# Patient Record
Sex: Female | Born: 1990 | Race: Black or African American | Hispanic: No | Marital: Single | Smoking: Never smoker
Health system: Southern US, Community
[De-identification: ages and names within clinical notes are randomized; demographics above are authoritative.]

## PROBLEM LIST (undated history)

## (undated) DIAGNOSIS — R011 Cardiac murmur, unspecified: Secondary | ICD-10-CM

## (undated) HISTORY — PX: BUNIONECTOMY: SHX129

---

## 2013-11-11 ENCOUNTER — Emergency Department (HOSPITAL_COMMUNITY)
Admission: EM | Admit: 2013-11-11 | Discharge: 2013-11-11 | Disposition: A | Discharge status: Home / Self Care | Payer: BC Managed Care – PPO | Attending: Emergency Medicine | Admitting: Emergency Medicine

## 2013-11-11 ENCOUNTER — Encounter (HOSPITAL_COMMUNITY): Payer: Self-pay | Admitting: Emergency Medicine

## 2013-11-11 DIAGNOSIS — S79919A Unspecified injury of unspecified hip, initial encounter: Secondary | ICD-10-CM | POA: Insufficient documentation

## 2013-11-11 DIAGNOSIS — S79929A Unspecified injury of unspecified thigh, initial encounter: Principal | ICD-10-CM

## 2013-11-11 DIAGNOSIS — Y9389 Activity, other specified: Secondary | ICD-10-CM | POA: Insufficient documentation

## 2013-11-11 DIAGNOSIS — Y92410 Unspecified street and highway as the place of occurrence of the external cause: Secondary | ICD-10-CM | POA: Insufficient documentation

## 2013-11-11 NOTE — ED Notes (Signed)
PT ambulated with baseline gait; VSS; A&Ox3; no signs of distress; respirations even and unlabored; skin warm and dry; no questions upon discharge.  
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
## 2013-11-12 NOTE — ED Provider Notes (Signed)
Medical screening examination/treatment/procedure(s) were performed by non-physician practitioner and as supervising physician I was immediately available for consultation/collaboration.   EKG Interpretation None        Elizabeth SkeensJoshua M Halena Mohar, MD 11/12/13 [id number]

## 2013-11-21 ENCOUNTER — Encounter (HOSPITAL_COMMUNITY): Payer: Self-pay | Admitting: Emergency Medicine

## 2013-11-21 ENCOUNTER — Emergency Department (HOSPITAL_COMMUNITY)
Admission: EM | Admit: 2013-11-21 | Discharge: 2013-11-21 | Disposition: A | Discharge status: Home / Self Care | Payer: BC Managed Care – PPO | Attending: Emergency Medicine | Admitting: Emergency Medicine

## 2013-11-21 DIAGNOSIS — M25569 Pain in unspecified knee: Secondary | ICD-10-CM | POA: Insufficient documentation

## 2013-11-21 DIAGNOSIS — G8911 Acute pain due to trauma: Secondary | ICD-10-CM | POA: Insufficient documentation

## 2013-11-21 DIAGNOSIS — M25561 Pain in right knee: Secondary | ICD-10-CM

## 2013-11-21 MED ORDER — IBUPROFEN 800 MG PO TABS: 800.0000 mg | ORAL_TABLET | Freq: Three times a day (TID) | ORAL | Status: DC
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
## 2013-11-21 NOTE — Discharge Instructions (Signed)
Take Ibuprofen 600-800 mg every 6-8 hours = take with food  Rest, use knee sleeve, elevated, and apply cold compresses for relief  Return to the emergency department if you develop any changing/worsening condition, knee swelling, redness, weakness, fever, leg swelling, or any other concerns (please read additional information regarding your condition below)     Knee Pain Knee pain can be a result of an injury or other medical conditions. Treatment will depend on the cause of your pain. HOME CARE  Only take medicine as told by your doctor.  Keep a healthy weight. Being overweight can make the knee hurt more.  Stretch before exercising or playing sports.  If there is constant knee pain, change the way you exercise. Ask your doctor for advice.  Make sure shoes fit well. Choose the right shoe for the sport or activity.  Protect your knees. Wear kneepads if needed.  Rest when you are tired. GET HELP RIGHT AWAY IF:   Your knee pain does not stop.  Your knee pain does not get better.  Your knee joint feels hot to the touch.  You have a fever. MAKE SURE YOU:   Understand these instructions.  Will watch this condition.  Will get help right away if you are not doing well or get worse. Document Released: 11/09/2008 Document Revised: 11/05/2011 Document Reviewed: 11/09/2008 [facility] Patient Information 2014 [place], [place].  RICE: Routine Care for Injuries The routine care of many injuries includes Rest, Ice, Compression, and Elevation (RICE). HOME CARE INSTRUCTIONS  Rest is needed to allow your body to heal. Routine activities can usually be resumed when comfortable. Injured tendons and bones can take up to 6 weeks to heal. Tendons are the cord-like structures that attach muscle to bone.  Ice following an injury helps keep the swelling down and reduces pain.  Put ice in a plastic bag.  Place a towel between your skin and the bag.  Leave the ice on for 15-20 minutes, 03-04  times a day. Do this while awake, for the first 24 to 48 hours. After that, continue as directed by your caregiver.  Compression helps keep swelling down. It also gives support and helps with discomfort. If an elastic bandage has been applied, it should be removed and reapplied every 3 to 4 hours. It should not be applied tightly, but firmly enough to keep swelling down. Watch fingers or toes for swelling, bluish discoloration, coldness, numbness, or excessive pain. If any of these problems occur, remove the bandage and reapply loosely. Contact your caregiver if these problems continue.  Elevation helps reduce swelling and decreases pain. With extremities, such as the arms, hands, legs, and feet, the injured area should be placed near or above the level of the heart, if possible. SEEK IMMEDIATE MEDICAL CARE IF:  You have persistent pain and swelling.  You develop redness, numbness, or unexpected weakness.  Your symptoms are getting worse rather than improving after several days. These symptoms may indicate that further evaluation or further X-rays are needed. Sometimes, X-rays may not show a small broken bone (fracture) until 1 week or 10 days later. Make a follow-up appointment with your caregiver. Ask when your X-ray results will be ready. Make sure you get your X-ray results. Document Released: 11/25/2000 Document Revised: 11/05/2011 Document Reviewed: 01/12/2011 [facility] Patient Information 2014 [place] HeadquartersExitCare, MarylandLLC.  Emergency Department Resource Guide 1) Find a Doctor and Pay Out of Pocket Although you won't have to find out who is covered by your insurance plan, it is  a good idea to ask around and get recommendations. You will then need to call the office and see if the doctor you have chosen will accept you as a new patient and what types of options they offer for patients who are self-pay. Some doctors offer discounts or will set up payment plans for their patients who do not have insurance,  but you will need to ask so you aren't surprised when you get to your appointment.  2) Contact Your Local Health Department Not all health departments have doctors that can see patients for sick visits, but many do, so it is worth a call to see if yours does. If you don't know where your local health department is, you can check in your phone book. The CDC also has a tool to help you locate your state's health department, and many state websites also have listings of all of their local health departments.  3) Find a Walk-in Clinic If your illness is not likely to be very severe or complicated, you may want to try a walk in clinic. These are popping up all over the country in pharmacies, drugstores, and shopping centers. They're usually staffed by nurse practitioners or physician assistants that have been trained to treat common illnesses and complaints. They're usually fairly quick and inexpensive. However, if you have serious medical issues or chronic medical problems, these are probably not your best option.  No Primary Care Doctor: - Call Health Connect at  [phone] - they can help you locate a primary care doctor that  accepts your insurance, provides certain services, etc. - Physician Referral Service- [phone]  Chronic Pain Problems: Organization         Address  Phone   Notes  [facility]  [phone] Patients need to be referred by their primary care doctor.   Medication Assistance: Organization         Address  Phone   Notes  [facility] [address] [place] [place], [place] [address] [phone] --Must be a resident of [facility] -- Must have NO insurance coverage whatsoever (no Medicaid/ Medicare, etc.) -- The pt. MUST have a primary care doctor that directs their care regularly and follows them in the community   [facility]  [phone]   [employer]  [phone]    Agencies that provide inexpensive  medical care: Organization         Address  Phone   Notes  [facility]  [phone]   [facility]    [phone]   [facility] [address] [place], [place] [address] [phone]   [facility] [address], [place] [phone]   Planned Parenthood    [phone]   [facility]    [phone]   Community [facility] and [facility]  [address], [place] Phone:  [phone], Fax:  [phone] Hours of Operation:  9 am - 6 pm, M-F.  Also accepts Medicaid/Medicare and self-pay.  [facility]  [address], [place] Phone: [phone], Fax: [phone]. Hours of Operation:  8:30 am - 5:30 pm, M-F.  Also accepts Medicaid and self-pay.  [facility] [place] [address] [place] Phone: [phone]   [facility] [address] Natasha Bence [place], [place] [phone], Ext. [phone] Mondays & Thursdays: 7-9 AM.  First 15 patients are seen on a first come, first serve basis.    Medicaid-accepting [facility] Providers:  Organization         Address  Phone   Notes  [facility] [address], [place] [phone] Also accepts self-pay patients.  [facility] [address] Laurell Josephs [place], [place]  [phone]   [facility] [address], [place] [phone]   [facility] [address] [place] [phone]   Renaye Rakers [address], [place]   [phone] Only accepts [place] Access [place] patients after they have their name applied to their card.   Self-Pay (no insurance) in [facility]:  Organization         Address  Phone   Notes  Sickle Cell Patients, [facility] [address] [place], [place] [phone]   [facility] [address] [place], [place] [phone]   [facility]  [address], [place] [phone]   [facility]/Dr. Osei-Bonsu  [address] [place] or [address], [place] [phone] Phone number for both [place] and [place] locations is the same.  [facility] and [facility] [address], [place] [phone]   [facility] [address] [place] or [address] [phone]   [facility] [address] [place] [phone], phone; [phone], fax Sees patients 1st and 3rd Saturday of every month.  Must not qualify for public or private insurance (i.e. Medicaid, Medicare, [place] Health Choice, Veterans' Benefits)  Household income should be no more than 200% of the poverty level The clinic cannot treat you if you are pregnant or think you are pregnant  Sexually transmitted diseases are not treated at the clinic.    Dental Care: Organization         Address  Phone  Notes  [facility] Department of [facility] [address] [place], [place] [phone] Accepts children up to age 5 who are enrolled in [place] or [place] Health Choice; pregnant women with a Medicaid card; and children who have applied for Medicaid or [place] Health Choice, but were declined, whose parents can pay a reduced fee at time of service.  [facility] Department of [facility]  [address], [place] [phone] Accepts children up to age 17 who are enrolled in [place] or [place] Health Choice; pregnant women with a Medicaid card; and children who have applied for Medicaid or [place] Health Choice, but were declined, whose parents can pay a reduced fee at time of service.  [facility]  [address] [place], [place] [phone] Patients are seen by appointment only. Walk-ins are not accepted.  [facility] will see patients 39 years of age and older. Monday - Tuesday (8am-5pm) Most Wednesdays (8:30-5pm) $30 per visit, cash only  [facility] Adult [facility]  [address], [facility] [phone] Patients are seen by appointment only. Walk-ins are not accepted. [facility] will see patients 59 years of age and older. One Wednesday Evening (Monthly: Volunteer Based).  $30 per visit, cash only  [employer]  [phone] for adults; Children under age 64, call Graduate  Pediatric Dentistry at [phone]. Children aged 77-14, please call [phone] to request a pediatric application.  Dental services are provided in all areas of dental care including fillings, crowns and bridges, complete and partial dentures, implants, gum treatment, root canals, and extractions. Preventive care is also provided. Treatment is provided to both adults and children. Patients are selected via a lottery and there is often a waiting list.   [facility] [address] [place]  [phone] [email or web address]   [facility] [address] [place], [place] [phone], Ext. [phone] Second and Fourth Thursday of each month, opens at 6:30 AM; Clinic ends at 9 AM.  Patients are seen on a first-come first-served basis, and a limited number are seen during each clinic.   [facility]  [address] Ether Griffins [place], [place] [phone]   Eligibility Requirements You must have lived in [place], [place], or [place] counties for at least the last three months.   You cannot be eligible for state or federal sponsored [place], including [employer], [place], or [employer].   You generally cannot be eligible for healthcare insurance through your employer.    How to apply: Eligibility screenings are held every Tuesday and Wednesday afternoon from 1:00 pm until 4:00 pm. You do not need an appointment for the  interview!  [facility] [address], [place], [place] [phone]   [facility] Health Department  [phone]   [facility] Health Department  [phone]   [facility] Health Department  [phone]    Behavioral Health Resources in the Community: Intensive Outpatient Programs Organization         Address  Phone  Notes  [facility] [address] [place], [place] [phone]   [facility] [address], [place], [place] [phone]   [facility] [address] [place], [place]  [phone]   [facility] [address]  [place], [place] [phone] or [phone]   Substance Abuse Resources Organization         Address  Phone  Notes  [facility]  ([phone]   [facility]  [phone]   [facility] [place]  [phone]   Floydene Flock  [phone]   Residential & Outpatient Substance Abuse Program  [phone]   Psychological Services Organization         Address  Phone  Notes  [facility] Behavioral Health  [phone]   [facility]  [phone]   [facility] [address] [place] [phone] or [phone]    Mobile Crisis Teams Organization         Address  Phone  Notes  [facility], Mobile Crisis Care Unit  [phone]   [facility]  [address] [place], [place] [phone]   Doristine Locks [address] [place] [phone]    Self-Help/Support Groups Organization         Address  Phone             Notes  [facility] [place] - variety of support groups  336- I7437963 Call for more information  Narcotics Anonymous (NA), [facility] [address], [employer] [place]  2 meetings at this location   Statistician         Address  Phone  Notes  [facility]  [address],  [place]  [phone]   °[facility] ° [address], [place], [place] [phone]   °[facility] [address], [place] [phone] Admissions: 8am-3pm M-F  °[facility] [address]    °[place], [place] [phone]   °[facility] [address], [place], [place] [phone]   °[facility] [address]  °[place], [place] [phone]   °[facility] - Intensive Outpatient [address], [place], [place] [phone]   °[facility] [address]  °[place], [place] 1-877-615-2722 or [phone]   °[facility] ([facility]) [address] [place], [place] [phone] Accepts Medicaid  °[facility] [address]  °[place] 1-800-659-3381 Substance Abuse/Addiction Treatment  ° °[place] Behavioral Health Resources °Organization         Address  Phone  Notes  °[facility]  (888) [phone]   °Julie Brannon, PhD [address] [place], [place]   (336) 349-5553 or (336) 951-0000   ° Behavioral   [address] °[place], [place] ([phone]) [phone]   °[facility] [address], [place], [place] (336) [phone] Insurance/Medicaid/sponsorship through [facility]  °[facility] [address]                                    [place], [place] ([phone]) [phone] Therapy/tele-psych/case  °[facility] [address]  ° [place],  (336) [phone]    °Dr. Arfeen  ([phone]) [phone]   °[facility] [place]  [employer] [place] [facility] 1) [address],  °2) [address], [place] °3)  [address], [place] ([phone]) [phone] °(336) [phone] ° °(336) [phone]   °[place] Child Abuse Hotline (336) 342-1394 or (336) 342-3537 (After Hours)    ° ° ° °

## 2013-11-28 NOTE — ED Provider Notes (Signed)
Medical screening examination/treatment/procedure(s) were performed by non-physician practitioner and as supervising physician I was immediately available for consultation/collaboration.   Raney Koeppen L Delonda Coley, MD 11/28/13 0837 

## 2014-12-10 ENCOUNTER — Emergency Department (HOSPITAL_COMMUNITY)
Admission: EM | Admit: 2014-12-10 | Discharge: 2014-12-10 | Disposition: A | Discharge status: Home / Self Care | Payer: BC Managed Care – PPO | Attending: Emergency Medicine | Admitting: Emergency Medicine

## 2014-12-10 ENCOUNTER — Encounter (HOSPITAL_COMMUNITY): Payer: Self-pay

## 2014-12-10 DIAGNOSIS — J029 Acute pharyngitis, unspecified: Secondary | ICD-10-CM | POA: Diagnosis present

## 2014-12-10 DIAGNOSIS — J069 Acute upper respiratory infection, unspecified: Secondary | ICD-10-CM | POA: Diagnosis not present

## 2014-12-10 DIAGNOSIS — B9789 Other viral agents as the cause of diseases classified elsewhere: Secondary | ICD-10-CM

## 2014-12-10 MED ORDER — PSEUDOEPHEDRINE HCL 60 MG PO TABS: 60.0000 mg | ORAL_TABLET | Freq: Four times a day (QID) | ORAL | Status: DC | PRN

## 2014-12-10 MED ORDER — IBUPROFEN 600 MG PO TABS: 600.0000 mg | ORAL_TABLET | Freq: Four times a day (QID) | ORAL | Status: DC | PRN

## 2014-12-10 NOTE — Discharge Instructions (Signed)
Please read and follow all provided instructions.  Your diagnoses today include:  1. Viral URI with cough     You appear to have an upper respiratory infection (URI). An upper respiratory tract infection, or cold, is a viral infection of the air passages leading to the lungs. It should improve gradually after 5-7 days. You may have a lingering cough that lasts for 2- 4 weeks after the infection.  Tests performed today include:  Vital signs. See below for your results today.   Medications prescribed:   Ibuprofen (Motrin, Advil) - anti-inflammatory pain medication  Do not exceed 600mg  ibuprofen every 6 hours, take with food  You have been prescribed an anti-inflammatory medication or NSAID. Take with food. Take smallest effective dose for the shortest duration needed for your pain. Stop taking if you experience stomach pain or vomiting.    Sudafed - medication for congestion  Take any prescribed medications only as directed. Treatment for your infection is aimed at treating the symptoms. There are no medications, such as antibiotics, that will cure your infection.   Home care instructions:  Follow any educational materials contained in this packet.   Your illness is contagious and can be spread to others, especially during the first 3 or 4 days. It cannot be cured by antibiotics or other medicines. Take basic precautions such as washing your hands often, covering your mouth when you cough or sneeze, and avoiding public places where you could spread your illness to others.   Please continue drinking plenty of fluids.  Use over-the-counter medicines as needed as directed on packaging for symptom relief.  You may also use ibuprofen or tylenol as directed on packaging for pain or fever.  Do not take multiple medicines containing Tylenol or acetaminophen to avoid taking too much of this medication.  Follow-up instructions: Please follow-up with your primary care provider in the next 3 days  for further evaluation of your symptoms if you are not feeling better.   Return instructions:   Please return to the Emergency Department if you experience worsening symptoms.   RETURN IMMEDIATELY IF you develop shortness of breath, confusion or altered mental status, a new rash, become dizzy, faint, or poorly responsive, or are unable to be cared for at home.  Please return if you have persistent vomiting and cannot keep down fluids or develop a fever that is not controlled by tylenol or motrin.    Please return if you have any other emergent concerns.  Additional Information:  Your vital signs today were: BP 127/75 mmHg   Pulse 89   Temp(Src) 99.1 F (37.3 C) (Oral)   Resp 18   Ht 5\' 7"  (1.702 m)   Wt 165 lb 8 oz (75.07 kg)   BMI 25.91 kg/m2   SpO2 97%   LMP 11/26/2014 (Approximate) If your blood pressure (BP) was elevated above 135/85 this visit, please have this repeated by your doctor within one month. --------------

## 2014-12-10 NOTE — ED Provider Notes (Signed)
CSN: [id number]     Arrival date & time 12/10/14  1152 History   First MD Initiated Contact with Patient 12/10/14 1209     Chief Complaint  Patient presents with  . Sore Throat     (Consider location/radiation/quality/duration/timing/severity/associated sxs/prior Treatment) HPI Comments: Patient presents with sore throat, nasal congestion, cough, headache which started approximately 1-1/2 weeks ago was a sore throat. The symptoms have improved however later the patient became more congested. Cough is productive at times. No nausea, vomiting, or diarrhea. She has not had any treatments prior to arrival. No sick contacts.  Patient is a 24 y.o. female presenting with pharyngitis. The history is provided by the patient.  Sore Throat Associated symptoms include congestion, coughing, headaches and a sore throat. Pertinent negatives include no abdominal pain, chills, fatigue, fever, myalgias, nausea, rash or vomiting.    History reviewed. No pertinent past medical history. Past Surgical History  Procedure Laterality Date  . Bunionectomy     History reviewed. No pertinent family history. History  Substance Use Topics  . Smoking status: Never Smoker   . Smokeless tobacco: Not on file  . Alcohol Use: Yes   OB History    No data available     Review of Systems  Constitutional: Negative for fever, chills and fatigue.  HENT: Positive for congestion, rhinorrhea and sore throat. Negative for ear pain and sinus pressure.   Eyes: Negative for redness.  Respiratory: Positive for cough. Negative for wheezing.   Gastrointestinal: Negative for nausea, vomiting, abdominal pain and diarrhea.  Genitourinary: Negative for dysuria.  Musculoskeletal: Negative for myalgias and neck stiffness.  Skin: Negative for rash.  Neurological: Positive for headaches.  Hematological: Negative for adenopathy.      Allergies  Review of patient's allergies indicates no known allergies.  Home Medications    Prior to Admission medications   Medication Sig Start Date End Date Taking? Authorizing Provider  ibuprofen (ADVIL,MOTRIN) 600 MG tablet Take 1 tablet (600 mg total) by mouth every 6 (six) hours as needed. 12/10/14   Renne CriglerJoshua Fred Franzen, PA-C  pseudoephedrine (SUDAFED) 60 MG tablet Take 1 tablet (60 mg total) by mouth every 6 (six) hours as needed for congestion. 12/10/14   Renne CriglerJoshua Shalev Helminiak, PA-C   BP 127/75 mmHg  Pulse 89  Temp(Src) 99.1 F (37.3 C) (Oral)  Resp 18  Ht 5\' 7"  (1.702 m)  Wt 165 lb 8 oz (75.07 kg)  BMI 25.91 kg/m2  SpO2 97%  LMP 11/26/2014 (Approximate) Physical Exam  Constitutional: She appears well-developed and well-nourished.  HENT:  Head: Normocephalic and atraumatic.  Right Ear: Tympanic membrane, external ear and ear canal normal.  Left Ear: Tympanic membrane, external ear and ear canal normal.  Nose: Mucosal edema and rhinorrhea present.  Mouth/Throat: Uvula is midline and mucous membranes are normal. Mucous membranes are not dry. No oral lesions. No trismus in the jaw. No uvula swelling. Posterior oropharyngeal erythema present. No oropharyngeal exudate, posterior oropharyngeal edema or tonsillar abscesses.  Eyes: Conjunctivae are normal. Right eye exhibits no discharge. Left eye exhibits no discharge.  Neck: Normal range of motion. Neck supple.  Cardiovascular: Normal rate, regular rhythm and normal heart sounds.   Pulmonary/Chest: Effort normal and breath sounds normal. No respiratory distress. She has no wheezes. She has no rales.  Abdominal: Soft. There is no tenderness.  Lymphadenopathy:    She has no cervical adenopathy.  Neurological: She is alert.  Skin: Skin is warm and dry.  Psychiatric: She has a normal mood and affect.  Nursing note and vitals reviewed.   ED Course  Procedures (including critical care time) Labs Review Labs Reviewed - No data to display  Imaging Review No results found.   EKG Interpretation None       12:25 PM Patient seen  and examined.   Vital signs reviewed and are as follows: BP 127/75 mmHg  Pulse 89  Temp(Src) 99.1 F (37.3 C) (Oral)  Resp 18  Ht  (1.702 m)  Wt 165 lb 8 oz (75.07 kg)  BMI 25.91 kg/m2  SpO2 97%  LMP 11/26/2014 (Approximate)  Patient counseled on supportive care for viral URI and s/s to return including worsening symptoms, persistent fever, persistent vomiting, or if they have any other concerns. Urged to see PCP if symptoms persist for more than 3 days. Patient verbalizes understanding and agrees with plan.    MDM   Final diagnoses:  Viral URI with cough   Patient with symptoms consistent with a viral syndrome. Vitals are stable, no fever. No signs of dehydration. Lung exam normal, no signs of pneumonia. Supportive therapy indicated with return if symptoms worsen.      Renne Crigler, PA-C 12/10/14 1227  Samuel Jester, DO 12/11/14 [phone]

## 2014-12-10 NOTE — ED Notes (Signed)
Pt presents with 1 week h/o difficulty swallowing, reports feeling throat "swollen", reports onset of nasal congestion, frontal headache and productive cough with clear phlegm.  Pt denies fever, unsure of sick contact.

## 2016-05-04 ENCOUNTER — Ambulatory Visit (HOSPITAL_COMMUNITY)
Admission: EM | Admit: 2016-05-04 | Discharge: 2016-05-04 | Disposition: A | Discharge status: Home / Self Care | Payer: 59 | Attending: Family Medicine | Admitting: Family Medicine

## 2016-05-04 ENCOUNTER — Encounter (HOSPITAL_COMMUNITY): Payer: Self-pay | Admitting: Family Medicine

## 2016-05-04 DIAGNOSIS — Z202 Contact with and (suspected) exposure to infections with a predominantly sexual mode of transmission: Secondary | ICD-10-CM | POA: Insufficient documentation

## 2016-05-04 MED ORDER — AZITHROMYCIN 250 MG PO TABS
1000.0000 mg | ORAL_TABLET | Freq: Once | ORAL | Status: AC
  Administered 2016-05-04: 1000 mg via ORAL

## 2016-05-04 MED ORDER — AZITHROMYCIN 250 MG PO TABS
ORAL_TABLET | ORAL | Status: AC
  Filled 2016-05-04: qty 1

## 2016-05-04 NOTE — ED Triage Notes (Signed)
Pt here for STD testing. She sts that her ex boyfriend called her and told her he had chlamydia. Denies symptoms. sts that she wants to be tested for everything

## 2016-05-04 NOTE — ED Provider Notes (Signed)
[facility]    CSN: [id number] Arrival date & time: 05/04/16  [id number]  First Provider Contact:  First MD Initiated Contact with Patient 05/04/16 2016        History   Chief Complaint Chief Complaint  Patient presents with  . Exposure to STD    HPI Elizabeth Richmond is a 25 y.o. female.   This is 25 year old woman who states that she had an exposure to female diagnosed with chlamydia. Her last partner let her know that she might have chlamydia at the end of August. She's had some discharge, but she did not have the money to pay for clinic visit until today.  Patient works at [employer].  Patient's had a mild discharge for several days now.  Her last menstrual period was a week ago.      History reviewed. No pertinent past medical history.  There are no active problems to display for this patient.   Past Surgical History:  Procedure Laterality Date  . BUNIONECTOMY      OB History    No data available       Home Medications    Prior to Admission medications   Not on File    Family History History reviewed. No pertinent family history.  Social History Social History  Substance Use Topics  . Smoking status: Never Smoker  . Smokeless tobacco: Never Used  . Alcohol use Yes     Allergies   Review of patient's allergies indicates no known allergies.   Review of Systems Review of Systems  Constitutional: Negative.   HENT: Negative.   Respiratory: Negative.   Cardiovascular: Negative.   Gastrointestinal: Negative.   Genitourinary: Positive for vaginal discharge.     Physical Exam Triage Vital Signs ED Triage Vitals [05/04/16 1933]  Enc Vitals Group     BP 112/65     Pulse Rate 60     Resp 12     Temp 98.5 F (36.9 C)     Temp Source Oral     SpO2 100 %     Weight      Height      Head Circumference      Peak Flow      Pain Score      Pain Loc      Pain Edu?      Excl. in GC?    No data found.   Updated Vital  Signs BP 112/65 (BP Location: Left Arm)   Pulse 60   Temp 98.5 F (36.9 C) (Oral)   Resp 12   LMP 04/25/2016   SpO2 100%   Visual Acuity Right Eye Distance:   Left Eye Distance:   Bilateral Distance:    Right Eye Near:   Left Eye Near:    Bilateral Near:     Physical Exam  Constitutional: She appears well-developed and well-nourished.  Skin: Skin is warm.  Psychiatric: She has a normal mood and affect. Her behavior is normal.  Nursing note and vitals reviewed.    UC Treatments / Results  Labs (all labs ordered are listed, but only abnormal results are displayed) Labs Reviewed  HIV ANTIBODY (ROUTINE TESTING)  RPR  URINE CYTOLOGY ANCILLARY ONLY    EKG  EKG Interpretation None       Radiology No results found.  Procedures Procedures (including critical care time)  Medications Ordered in UC Medications  azithromycin (ZITHROMAX) tablet 1,000 mg (not administered)     Initial Impression /  Assessment and Plan / UC Course  I have reviewed the triage vital signs and the nursing notes.  Pertinent labs & imaging results that were available during my care of the patient were reviewed by me and considered in my medical decision making (see chart for details).  Clinical Course    Final Clinical Impressions(s) / UC Diagnoses   Final diagnoses:  STD exposure    New Prescriptions New Prescriptions   No medications on file     Elvina Sidle, MD 05/04/16 2026

## 2016-05-05 LAB — RPR: RPR Ser Ql: NONREACTIVE

## 2016-05-05 LAB — HIV ANTIBODY (ROUTINE TESTING W REFLEX): HIV Screen 4th Generation wRfx: NONREACTIVE

## 2016-05-07 LAB — URINE CYTOLOGY ANCILLARY ONLY
Chlamydia: POSITIVE — AB
Neisseria Gonorrhea: NEGATIVE
Trichomonas: NEGATIVE

## 2016-05-09 ENCOUNTER — Telehealth: Payer: Self-pay | Admitting: Emergency Medicine

## 2016-05-09 NOTE — Telephone Encounter (Signed)
Spoke with patient Seen and treated with Azithromycin X4 tabs po at the Urgent Care

## 2016-05-09 NOTE — Telephone Encounter (Signed)
-----   Message from Elvina SidleKurt Lauenstein, MD sent at 05/07/2016  9:53 PM EDT ----- Patient has abnormal lab values.   Please notify patient and make sure she has taken azithromycin 1 gm po

## 2016-05-16 ENCOUNTER — Telehealth (HOSPITAL_COMMUNITY): Payer: Self-pay | Admitting: Emergency Medicine

## 2016-05-16 NOTE — Telephone Encounter (Addendum)
See phone note from Edwena FeltyJill S, RN on 9/13  Pt has been informed  [facility]

## 2016-05-16 NOTE — Telephone Encounter (Signed)
-----   Message from Eustace MooreLaura W Murray, MD sent at 05/08/2016  5:15 PM EDT ----- [facility] please let health department know of chlamydia positive and rx for zithromax given.   Pt notified of result by [facility] office staff.  LM

## 2016-11-10 ENCOUNTER — Emergency Department (HOSPITAL_COMMUNITY): Payer: BLUE CROSS/BLUE SHIELD

## 2016-11-10 ENCOUNTER — Emergency Department (HOSPITAL_COMMUNITY)
Admission: EM | Admit: 2016-11-10 | Discharge: 2016-11-10 | Disposition: A | Discharge status: Home / Self Care | Payer: BLUE CROSS/BLUE SHIELD | Attending: Emergency Medicine | Admitting: Emergency Medicine

## 2016-11-10 ENCOUNTER — Encounter (HOSPITAL_COMMUNITY): Payer: Self-pay

## 2016-11-10 DIAGNOSIS — S90415A Abrasion, left lesser toe(s), initial encounter: Secondary | ICD-10-CM | POA: Insufficient documentation

## 2016-11-10 DIAGNOSIS — W208XXA Other cause of strike by thrown, projected or falling object, initial encounter: Secondary | ICD-10-CM | POA: Diagnosis not present

## 2016-11-10 DIAGNOSIS — Y939 Activity, unspecified: Secondary | ICD-10-CM | POA: Diagnosis not present

## 2016-11-10 DIAGNOSIS — Y999 Unspecified external cause status: Secondary | ICD-10-CM | POA: Insufficient documentation

## 2016-11-10 DIAGNOSIS — S99922A Unspecified injury of left foot, initial encounter: Secondary | ICD-10-CM | POA: Diagnosis present

## 2016-11-10 DIAGNOSIS — Y929 Unspecified place or not applicable: Secondary | ICD-10-CM | POA: Insufficient documentation

## 2016-11-10 DIAGNOSIS — S99921A Unspecified injury of right foot, initial encounter: Secondary | ICD-10-CM

## 2016-11-10 NOTE — ED Triage Notes (Signed)
Pt c/o left foot pain after a shelf fell on her foot. Hx of surgery in same foot. Small bandaged cut on L second toe. A&Ox4. Ambulatory.

## 2016-11-10 NOTE — ED Provider Notes (Signed)
[facility] Provider Note   CSN: [id number] Arrival date & time: 11/10/16  2129     History   Chief Complaint Chief Complaint  Patient presents with  . Foot Pain    L    HPI Elizabeth Richmond is a 26 y.o. female.  The history is provided by the patient and medical records.  Foot Pain   26 year old female here with left foot pain. She dropped a shelf on her toe while wearing sandals. Reports she had some bleeding from the left second toe and was concerned. She has had prior surgery on the left foot secondary to fracture.  Reports he has been ambulatory since the incident, but has pain in the ball of her foot when doing so. She denies any numbness or weakness of her left foot. She's not tried any intervention prior to arrival.  History reviewed. No pertinent past medical history.  There are no active problems to display for this patient.   Past Surgical History:  Procedure Laterality Date  . BUNIONECTOMY      OB History    No data available       Home Medications    Prior to Admission medications   Not on File    Family History History reviewed. No pertinent family history.  Social History Social History  Substance Use Topics  . Smoking status: Never Smoker  . Smokeless tobacco: Never Used  . Alcohol use Yes     Allergies   Patient has no known allergies.   Review of Systems Review of Systems  Musculoskeletal: Positive for arthralgias.  Skin: Positive for wound.  All other systems reviewed and are negative.    Physical Exam Updated Vital Signs BP 127/76 (BP Location: Right Arm)   Pulse 85   Temp 98.7 F (37.1 C) (Oral)   Resp 18   LMP 10/15/2016   SpO2 100%   Physical Exam  Constitutional: She is oriented to person, place, and time. She appears well-developed and well-nourished.  HENT:  Head: Normocephalic and atraumatic.  Mouth/Throat: Oropharynx is clear and moist.  Eyes: Conjunctivae and EOM are normal. Pupils are equal,  round, and reactive to light.  Neck: Normal range of motion.  Cardiovascular: Normal rate, regular rhythm and normal heart sounds.   Pulmonary/Chest: Effort normal and breath sounds normal.  Abdominal: Soft. Bowel sounds are normal.  Musculoskeletal: Normal range of motion.  Very minor abrasion of the dorsal left second toe without active bleeding, there is no swelling or bony deformity of the foot or toes, full range of motion of the ankle, foot, and all toes, DP pulse intact, normal sensation and cap refill  Neurological: She is alert and oriented to person, place, and time.  Skin: Skin is warm and dry.  Psychiatric: She has a normal mood and affect.  Nursing note and vitals reviewed.    ED Treatments / Results  Labs (all labs ordered are listed, but only abnormal results are displayed) Labs Reviewed - No data to display  EKG  EKG Interpretation None       Radiology Dg Foot Complete Left  Result Date: 11/10/2016 CLINICAL DATA:  Patient dropped a shelf onto the left foot. Pain in the first and second toes. Small cut on the second toe. EXAM: LEFT FOOT - COMPLETE 3+ VIEW COMPARISON:  None. FINDINGS: Postoperative changes with staple fixation of represent osteotomy at the first proximal phalanx and plate and screw fixation across the first metatarsal-phalangeal joint. Mild hallux valgus deformity. No evidence  of acute fracture or dislocation of the left foot. No focal bone lesion or bone destruction. Soft tissues are unremarkable. IMPRESSION: Postoperative changes in the left foot. Hallux valgus deformity. No acute bony abnormalities. Electronically Signed   By: Burman Nieves M.D.   On: 11/10/2016 22:15    Procedures Procedures (including critical care time)  Medications Ordered in ED Medications - No data to display   Initial Impression / Assessment and Plan / ED Course  I have reviewed the triage vital signs and the nursing notes.  Pertinent labs & imaging results that  were available during my care of the patient were reviewed by me and considered in my medical decision making (see chart for details).  26 year old female here with left foot injury after dropping a shelf onto her foot while wearing sandals. She has a very minor abrasion of the dorsal left second toe, otherwise atraumatic. Foot is neurovascularly intact without any gross deformity. X-ray without any acute findings. Dressing applied. Discussed home wound care regarding the abrasion. Follow-up with PCP.  Discussed plan with patient, she acknowledged understanding and agreed with plan of care.  Return precautions given for new or worsening symptoms.  Final Clinical Impressions(s) / ED Diagnoses   Final diagnoses:  Right foot injury, initial encounter    New Prescriptions There are no discharge medications for this patient.    Garlon Hatchet, PA-C 11/10/16 2258    Shaune Pollack, MD 11/11/16 [phone]

## 2016-11-10 NOTE — Discharge Instructions (Signed)
Can keep foot wrapped for comfort.  May wish to ice and elevate at home if you begin to have swelling/bruising. Can follow-up with your primary care doctor. Return here for new concerns.

## 2016-12-17 ENCOUNTER — Ambulatory Visit (HOSPITAL_COMMUNITY)
Admission: EM | Admit: 2016-12-17 | Discharge: 2016-12-17 | Disposition: A | Discharge status: Home / Self Care | Payer: BLUE CROSS/BLUE SHIELD | Attending: Family Medicine | Admitting: Family Medicine

## 2016-12-17 ENCOUNTER — Encounter (HOSPITAL_COMMUNITY): Payer: Self-pay | Admitting: Emergency Medicine

## 2016-12-17 DIAGNOSIS — R69 Illness, unspecified: Secondary | ICD-10-CM

## 2016-12-17 DIAGNOSIS — J111 Influenza due to unidentified influenza virus with other respiratory manifestations: Secondary | ICD-10-CM

## 2016-12-17 HISTORY — DX: Cardiac murmur, unspecified: R01.1

## 2016-12-17 MED ORDER — ONDANSETRON 4 MG PO TBDP: 4.0000 mg | ORAL_TABLET | Freq: Three times a day (TID) | ORAL | 0 refills | Status: AC | PRN

## 2016-12-17 MED ORDER — OSELTAMIVIR PHOSPHATE 75 MG PO CAPS: 75.0000 mg | ORAL_CAPSULE | Freq: Two times a day (BID) | ORAL | 0 refills | Status: AC

## 2016-12-17 MED ORDER — LORATADINE-PSEUDOEPHEDRINE ER 10-240 MG PO TB24: 1.0000 | ORAL_TABLET | Freq: Every day | ORAL | 1 refills | Status: AC

## 2016-12-17 MED ORDER — BENZONATATE 100 MG PO CAPS
100.0000 mg | ORAL_CAPSULE | Freq: Three times a day (TID) | ORAL | 0 refills | Status: AC
Start: 2016-12-17 — End: ?

## 2016-12-17 NOTE — ED Provider Notes (Signed)
CSN: [id number]     Arrival date & time 12/17/16  1132 History   None    Chief Complaint  Patient presents with  . flu like symptoms   (Consider location/radiation/quality/duration/timing/severity/associated sxs/prior Treatment) 26 year old female presents to clinic with flulike symptoms for the last 24 hours   The history is provided by the patient.  URI  Presenting symptoms: congestion, cough, fatigue, fever, rhinorrhea and sore throat   Severity:  Moderate Onset quality:  Gradual Duration:  1 day Timing:  Constant Progression:  Worsening Chronicity:  New Relieved by:  Nothing Worsened by:  Nothing Ineffective treatments:  OTC medications Associated symptoms: headaches, myalgias and sinus pain   Associated symptoms: no neck pain, no sneezing, no swollen glands and no wheezing   Risk factors: not elderly, no diabetes mellitus, no immunosuppression and no recent illness     Past Medical History:  Diagnosis Date  . Heart murmur    Past Surgical History:  Procedure Laterality Date  . BUNIONECTOMY     History reviewed. No pertinent family history. Social History  Substance Use Topics  . Smoking status: Never Smoker  . Smokeless tobacco: Never Used  . Alcohol use Yes   OB History    No data available     Review of Systems  Constitutional: Positive for appetite change, chills, fatigue and fever.  HENT: Positive for congestion, rhinorrhea, sinus pain and sore throat. Negative for sneezing.   Eyes: Negative.   Respiratory: Positive for cough. Negative for shortness of breath and wheezing.   Cardiovascular: Negative for chest pain and palpitations.  Gastrointestinal: Positive for nausea. Negative for abdominal pain and diarrhea.  Genitourinary: Negative.   Musculoskeletal: Positive for myalgias. Negative for neck pain and neck stiffness.  Skin: Negative.   Neurological: Positive for headaches. Negative for dizziness.    Allergies  Patient has no known  allergies.  Home Medications   Prior to Admission medications   Medication Sig Start Date End Date Taking? Authorizing Provider  benzonatate (TESSALON) 100 MG capsule Take 1 capsule (100 mg total) by mouth every 8 (eight) hours. 12/17/16   Dorena Bodo, NP  loratadine-pseudoephedrine (CLARITIN-D 24-HOUR) 10-240 MG 24 hr tablet Take 1 tablet by mouth daily. 12/17/16   Dorena Bodo, NP  ondansetron (ZOFRAN ODT) 4 MG disintegrating tablet Take 1 tablet (4 mg total) by mouth every 8 (eight) hours as needed for nausea or vomiting. 12/17/16   Dorena Bodo, NP  oseltamivir (TAMIFLU) 75 MG capsule Take 1 capsule (75 mg total) by mouth every 12 (twelve) hours. 12/17/16   Dorena Bodo, NP   Meds Ordered and Administered this Visit  Medications - No data to display  BP 132/72 (BP Location: Right Arm)   Pulse (!) 106   Temp 99.5 F (37.5 C) (Oral)   LMP 12/03/2016 (Approximate)   SpO2 100%  No data found.   Physical Exam  Constitutional: She is oriented to person, place, and time. She appears well-developed and well-nourished. She appears ill. No distress.  HENT:  Head: Normocephalic and atraumatic.  Right Ear: Tympanic membrane and external ear normal.  Left Ear: Tympanic membrane and external ear normal.  Nose: Rhinorrhea present. Right sinus exhibits no maxillary sinus tenderness and no frontal sinus tenderness. Left sinus exhibits no maxillary sinus tenderness and no frontal sinus tenderness.  Mouth/Throat: Uvula is midline and oropharynx is clear and moist. No oropharyngeal exudate.  Eyes: Conjunctivae are normal. Right eye exhibits no discharge. Left eye exhibits no discharge.  Neck: Normal range  of motion. Neck supple. No JVD present.  Cardiovascular: Normal rate and regular rhythm.   Pulmonary/Chest: Effort normal and breath sounds normal. No respiratory distress. She has no wheezes.  Abdominal: Soft. Bowel sounds are normal. She exhibits no distension. There is no  tenderness. There is no guarding.  Lymphadenopathy:    She has no cervical adenopathy.  Neurological: She is alert and oriented to person, place, and time.  Skin: Skin is warm and dry. Capillary refill takes less than 2 seconds. She is not diaphoretic.  Psychiatric: She has a normal mood and affect. Her behavior is normal.  Nursing note and vitals reviewed.   Urgent Care Course     Procedures (including critical care time)  Labs Review Labs Reviewed - No data to display  Imaging Review No results found.    MDM   1. Influenza-like illness     Treating for flulike illness, given Tamiflu, Tessalon, Zofran, provided counseling on over-the-counter therapies for symptom management, given work note, encouraged follow-up with primary care return to clinic in one week if symptoms persist     Dorena Bodo, NP 12/17/16 1237

## 2016-12-17 NOTE — Discharge Instructions (Signed)
You most likely have a viral URI like the flu, or a flulike illness, I advise rest, plenty of fluids and management of symptoms with over the counter medicines. For symptoms you may take Tylenol as needed every 4-6 hours for body aches or fever, not to exceed 4,000 mg a day, Take mucinex or mucinex DM ever 12 hours with a full glass of water, you may use an inhaled steroid such as Flonase, 2 sprays each nostril once a day for congestion, or an antihistamine such as Claritin or Zyrtec once a day. For treatment of influenza, I have prescribed Tamiflu. Take 1 tablet twice a day for 5 days. For cough, I have prescribed a medication called Tessalon. Take 1 tablet every 8 hours as needed for your cough. For Nausea, I have prescribed Zofran, take 1 tablet under the tongue every 8 hours as needed. Should your symptoms worsen or fail to resolve, follow up with your primary care provider or return to clinic.

## 2016-12-17 NOTE — ED Triage Notes (Signed)
Pt has been suffering from body aches, nasal congestion, and sore throat since yesterday.

## 2017-08-22 IMAGING — CR DG FOOT COMPLETE 3+V*L*
3 series · 3 of 3 positions shown · non-contrast
Comparison: None.

CLINICAL DATA: Patient dropped a shelf onto the left foot. Pain in
the first and second toes. Small cut on the second toe.

EXAM:
LEFT FOOT - COMPLETE 3+ VIEW

[x foot ap left]
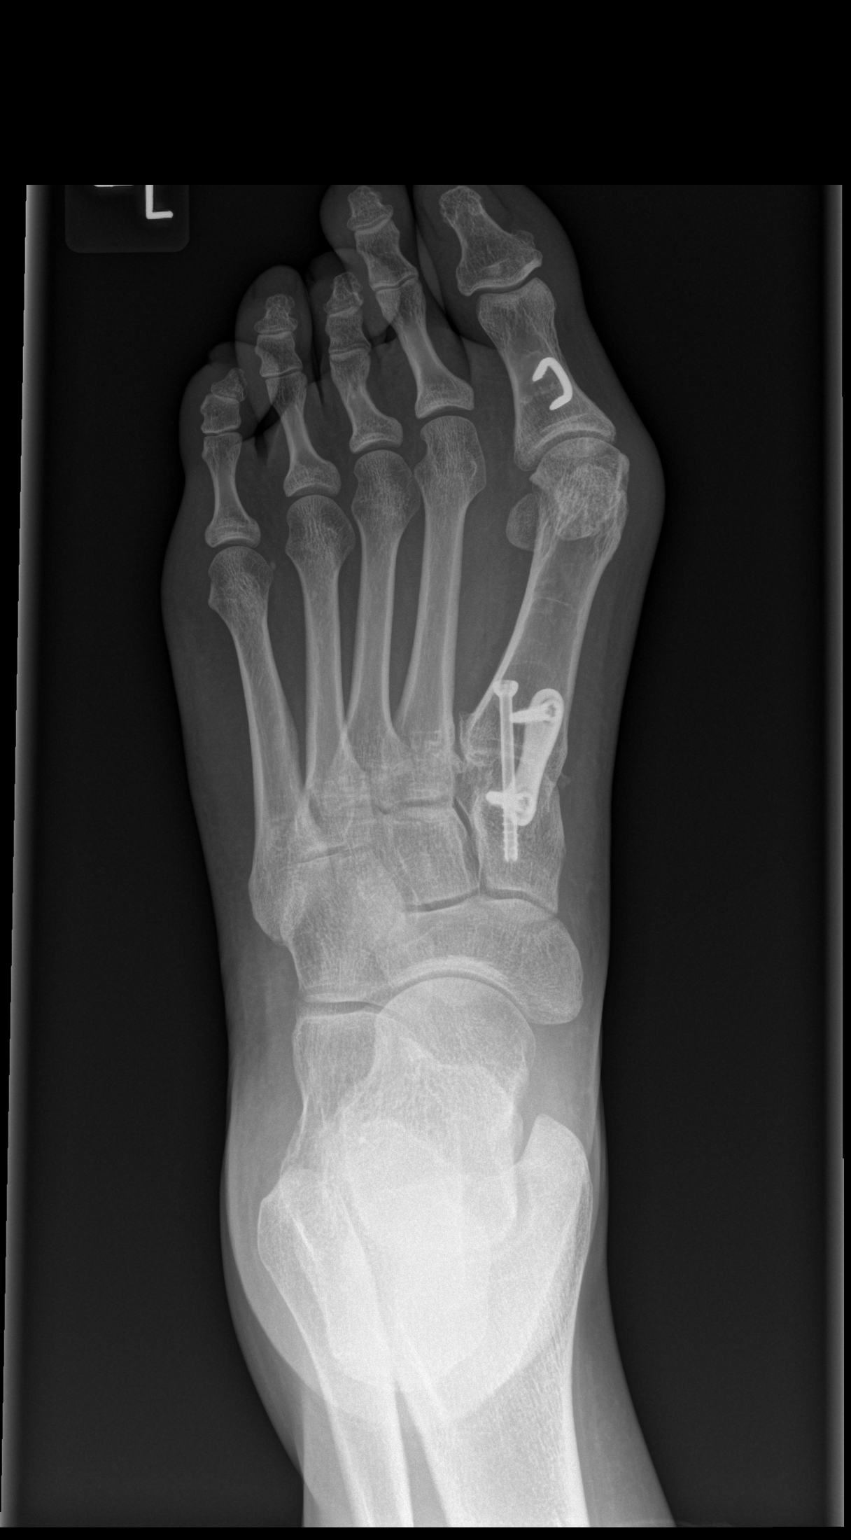

[x foot obl left]
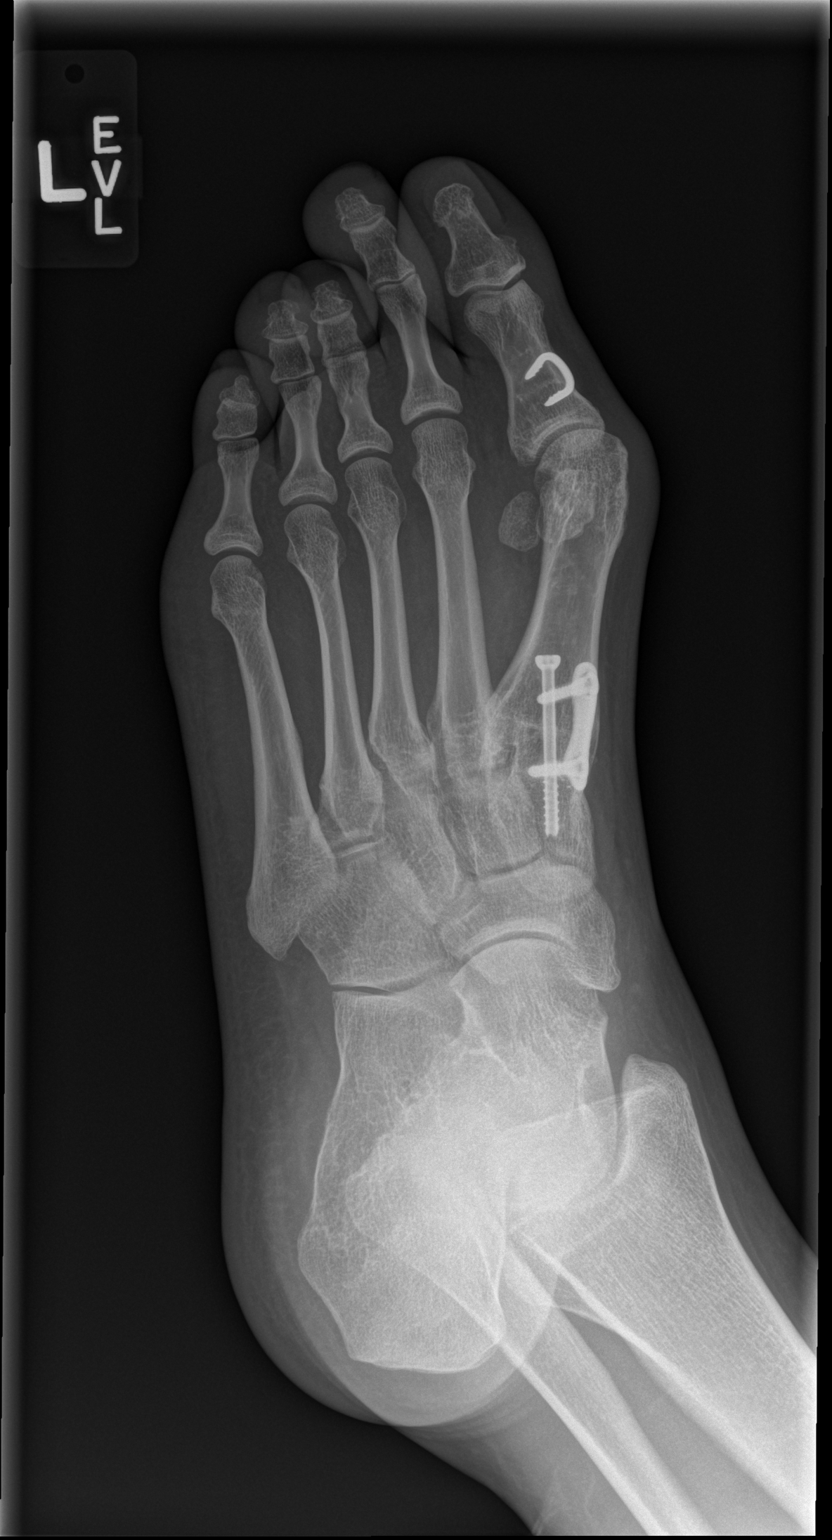

[x foot lat left]
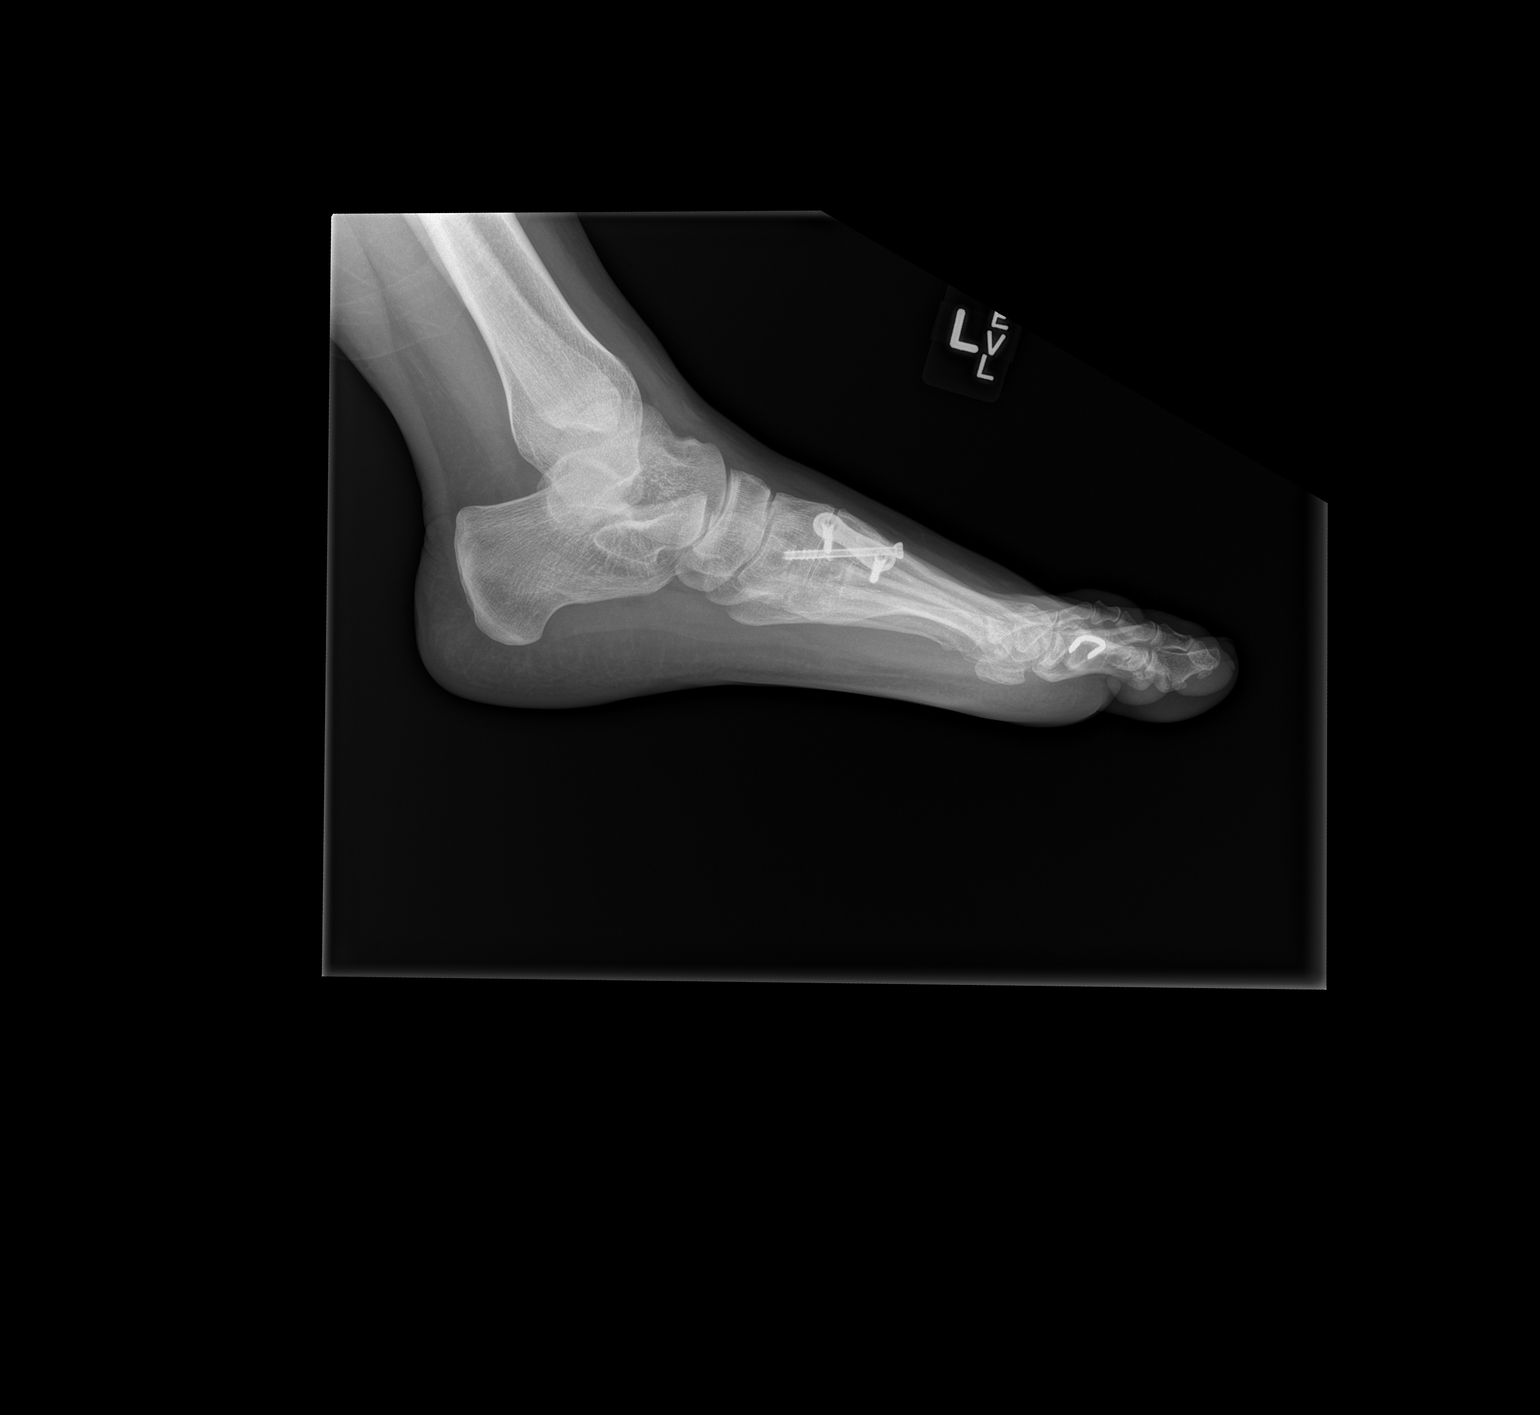

[3 of 3 positions shown; findings below may reference images not displayed]

FINDINGS: Postoperative changes with staple fixation of represent osteotomy at
the first proximal phalanx and plate and screw fixation across the
first metatarsal-phalangeal joint. Mild hallux valgus deformity. No
evidence of acute fracture or dislocation of the left foot. No focal
bone lesion or bone destruction. Soft tissues are unremarkable.
IMPRESSION: Postoperative changes in the left foot. Hallux valgus deformity. No
acute bony abnormalities.
